# Patient Record
Sex: Female | Born: 1990
Health system: Southern US, Community
[De-identification: ages and names within clinical notes are randomized; demographics above are authoritative.]

---

## 2015-11-15 ENCOUNTER — Emergency Department (HOSPITAL_BASED_OUTPATIENT_CLINIC_OR_DEPARTMENT_OTHER)
Admission: EM | Admit: 2015-11-15 | Discharge: 2015-11-15 | Disposition: A | Discharge status: Home / Self Care | Payer: Self-pay | Attending: Emergency Medicine | Admitting: Emergency Medicine

## 2015-11-15 ENCOUNTER — Encounter (HOSPITAL_BASED_OUTPATIENT_CLINIC_OR_DEPARTMENT_OTHER): Payer: Self-pay | Admitting: Emergency Medicine

## 2015-11-15 DIAGNOSIS — M545 Low back pain, unspecified: Secondary | ICD-10-CM

## 2015-11-15 MED ORDER — METHOCARBAMOL 500 MG PO TABS: 500.0000 mg | ORAL_TABLET | Freq: Two times a day (BID) | ORAL | Status: DC

## 2015-11-15 MED ORDER — ACETAMINOPHEN 325 MG PO TABS
650.0000 mg | ORAL_TABLET | Freq: Once | ORAL | Status: AC
  Administered 2015-11-15: 650 mg via ORAL
  Filled 2015-11-15: qty 2

## 2015-11-15 MED ORDER — METHOCARBAMOL 500 MG PO TABS
500.0000 mg | ORAL_TABLET | Freq: Once | ORAL | Status: AC
  Administered 2015-11-15: 500 mg via ORAL
  Filled 2015-11-15: qty 1

## 2015-11-15 NOTE — Discharge Instructions (Signed)
Back Pain, Adult °Back pain is very common in adults. The cause of back pain is rarely dangerous and the pain often gets better over time. The cause of your back pain may not be known. Some common causes of back pain include: °1. Strain of the muscles or ligaments supporting the spine. °2. Wear and tear (degeneration) of the spinal disks. °3. Arthritis. °4. Direct injury to the back. °For many people, back pain may return. Since back pain is rarely dangerous, most people can learn to manage this condition on their own. °HOME CARE INSTRUCTIONS °Watch your back pain for any changes. The following actions may help to lessen any discomfort you are feeling: °1. Remain active. It is stressful on your back to sit or stand in one place for long periods of time. Do not sit, drive, or stand in one place for more than 30 minutes at a time. Take short walks on even surfaces as soon as you are able. Try to increase the length of time you walk each day. °2. Exercise regularly as directed by your health care provider. Exercise helps your back heal faster. It also helps avoid future injury by keeping your muscles strong and flexible. °3. Do not stay in bed. Resting more than 1-2 days can delay your recovery. °4. Pay attention to your body when you bend and lift. The most comfortable positions are those that put less stress on your recovering back. Always use proper lifting techniques, including: °1. Bending your knees. °2. Keeping the load close to your body. °3. Avoiding twisting. °5. Find a comfortable position to sleep. Use a firm mattress and lie on your side with your knees slightly bent. If you lie on your back, put a pillow under your knees. °6. Avoid feeling anxious or stressed. Stress increases muscle tension and can worsen back pain. It is important to recognize when you are anxious or stressed and learn ways to manage it, such as with exercise. °7. Take medicines only as directed by your health care provider.  Over-the-counter medicines to reduce pain and inflammation are often the most helpful. Your health care provider may prescribe muscle relaxant drugs. These medicines help dull your pain so you can more quickly return to your normal activities and healthy exercise. °8. Apply ice to the injured area: °1. Put ice in a plastic bag. °2. Place a towel between your skin and the bag. °3. Leave the ice on for 20 minutes, 2-3 times a day for the first 2-3 days. After that, ice and heat may be alternated to reduce pain and spasms. °9. Maintain a healthy weight. Excess weight puts extra stress on your back and makes it difficult to maintain good posture. °SEEK MEDICAL CARE IF: °1. You have pain that is not relieved with rest or medicine. °2. You have increasing pain going down into the legs or buttocks. °3. You have pain that does not improve in one week. °4. You have night pain. °5. You lose weight. °6. You have a fever or chills. °SEEK IMMEDIATE MEDICAL CARE IF:  °1. You develop new bowel or bladder control problems. °2. You have unusual weakness or numbness in your arms or legs. °3. You develop nausea or vomiting. °4. You develop abdominal pain. °5. You feel faint. °  °This information is not intended to replace advice given to you by your health care provider. Make sure you discuss any questions you have with your health care provider. °  °Document Released: 08/12/2005 Document Revised: 09/02/2014 Document Reviewed: 12/14/2013 °[employer] Interactive Patient Education ©2016 [employer] ° °Back Exercises °The following exercises strengthen the muscles that help to support the back. They also help to keep the lower back flexible. Doing these exercises can help to prevent back pain or lessen existing pain. °If you have back pain or discomfort, try doing these exercises 2-3 times each day or as told by your health care provider. When the pain goes away, do them once each day, but increase the number of times that you repeat the  steps for each exercise (do more repetitions). If you do not have back pain or discomfort, do these exercises once each day or as told by your health care provider. °EXERCISES °Single Knee to Chest °Repeat these steps 3-5 times for each leg: °5. Lie on your back on a firm bed or the floor with your legs extended. °6. Bring one knee to your chest. Your other leg should stay extended and in contact with the floor. °7. Hold your knee in place by grabbing your knee or thigh. °8. Pull on your knee until you feel a gentle stretch in your lower back. °9. Hold the stretch for 10-30 seconds. °10. Slowly release and straighten your leg. °Pelvic Tilt °Repeat these steps 5-10 times: °10. Lie on your back on a firm bed or the floor with your legs extended. °11. Bend your knees so they are pointing toward the ceiling and your feet are flat on the floor. °12. Tighten your lower abdominal muscles to press your lower back against the floor. This motion will tilt your pelvis so your tailbone points up toward the ceiling instead of pointing to your feet or the floor. °13. With gentle tension and even breathing, hold this position for 5-10 seconds. °Cat-Cow °Repeat these steps until your lower back becomes more flexible: °7. Get into a hands-and-knees position on a firm surface. Keep your hands under your shoulders, and keep your knees under your hips. You may place padding under your knees for comfort. °8. Let your head hang down, and point your tailbone toward the floor so your lower back becomes rounded like the back of a cat. °9. Hold this position for 5 seconds. °10. Slowly lift your head and point your tailbone up toward the ceiling so your back forms a sagging arch like the back of a cow. °11. Hold this position for 5 seconds. °Press-Ups °Repeat these steps 5-10 times: °6. Lie on your abdomen (face-down) on the floor. °7. Place your palms near your head, about shoulder-width apart. °8. While you keep your back as relaxed as  possible and keep your hips on the floor, slowly straighten your arms to raise the top half of your body and lift your shoulders. Do not use your back muscles to raise your upper torso. You may adjust the placement of your hands to make yourself more comfortable. °9. Hold this position for 5 seconds while you keep your back relaxed. °10. Slowly return to lying flat on the floor. °Bridges °Repeat these steps 10 times: °1. Lie on your back on a firm surface. °2. Bend your knees so they are pointing toward the ceiling and your feet are flat on the floor. °3. Tighten your buttocks muscles and lift your buttocks off of the floor until your waist is at almost the same height as your knees. You should feel the muscles working in your buttocks and the back of your thighs. If you do not feel these muscles, slide your feet 1-2 inches farther away from your buttocks. °4. Hold this   position for 3-5 seconds. °5. Slowly lower your hips to the starting position, and allow your buttocks muscles to relax completely. °If this exercise is too easy, try doing it with your arms crossed over your chest. °Abdominal Crunches °Repeat these steps 5-10 times: °1. Lie on your back on a firm bed or the floor with your legs extended. °2. Bend your knees so they are pointing toward the ceiling and your feet are flat on the floor. °3. Cross your arms over your chest. °4. Tip your chin slightly toward your chest without bending your neck. °5. Tighten your abdominal muscles and slowly raise your trunk (torso) high enough to lift your shoulder blades a tiny bit off of the floor. Avoid raising your torso higher than that, because it can put too much stress on your low back and it does not help to strengthen your abdominal muscles. °6. Slowly return to your starting position. °Back Lifts °Repeat these steps 5-10 times: °1. Lie on your abdomen (face-down) with your arms at your sides, and rest your forehead on the floor. °2. Tighten the muscles in your  legs and your buttocks. °3. Slowly lift your chest off of the floor while you keep your hips pressed to the floor. Keep the back of your head in line with the curve in your back. Your eyes should be looking at the floor. °4. Hold this position for 3-5 seconds. °5. Slowly return to your starting position. °SEEK MEDICAL CARE IF: °· Your back pain or discomfort gets much worse when you do an exercise. °· Your back pain or discomfort does not lessen within 2 hours after you exercise. °If you have any of these problems, stop doing these exercises right away. Do not do them again unless your health care provider says that you can. °SEEK IMMEDIATE MEDICAL CARE IF: °· You develop sudden, severe back pain. If this happens, stop doing the exercises right away. Do not do them again unless your health care provider says that you can. °  °This information is not intended to replace advice given to you by your health care provider. Make sure you discuss any questions you have with your health care provider. °  °Document Released: 09/19/2004 Document Revised: 05/03/2015 Document Reviewed: 10/06/2014 °[employer]vier Interactive Patient Education ©2016 [employer] ° °

## 2015-11-15 NOTE — ED Notes (Signed)
Patient states that she woke up this am with lower back pain. States that it was so bad she could not go to work today. Denis any injury

## 2015-11-15 NOTE — ED Provider Notes (Signed)
CSN: [id number]     Arrival date & time 11/15/15  1718 History   First MD Initiated Contact with Patient 11/15/15 1831     Chief Complaint  Patient presents with  . Back Pain   Patient is a 25 y.o. female presenting with back pain. The history is provided by the patient. No language interpreter was used.  Back Pain Location:  Lumbar spine Quality:  Aching Radiates to:  Does not radiate Pain severity:  Moderate Onset quality:  Gradual Duration:  1 day Timing:  Constant Progression:  Unchanged Chronicity:  Recurrent Context: lifting heavy objects   Context: not falling, not MVA, not recent injury and not twisting   Relieved by:  Bed rest Worsened by:  Ambulation and bending Ineffective treatments:  None tried Associated symptoms: no abdominal pain, no bladder incontinence, no bowel incontinence, no dysuria, no fever, no leg pain, no numbness, no paresthesias, no perianal numbness and no weakness    Jaclyn Lawson is a 25 year old female presenting with back pain. Onset of pain was yesterday. The pain is located over the lumbar region. The pain does not radiate to the legs. She describes it as an aching pain. 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 Bending and ambulating exacerbate the pain. She states the pain was so severe that she did not go to work today. Massaging the area improves the pain. She has not taken any over-the-counter pain relievers. She reports a history of similar asked pain related to her work. She states that she has tried Neurontin for this in the past with no improvement. She does not have a PCP who follows her for her episodic back pain. She denies bowel or bladder incontinence or saddle anesthesias. She denies trauma to the back. Denies fevers, chills, abdominal pain, nausea, vomiting, dysuria, hematuria, vaginal discharge, extremity numbness, extremity weakness or gait difficulties.  History reviewed. No pertinent past medical history. History  reviewed. No pertinent past surgical history. History reviewed. No pertinent family history. Social History  Substance Use Topics  . Smoking status: Never Smoker   . Smokeless tobacco: None  . Alcohol Use: No   OB History    No data available     Review of Systems  Constitutional: Negative for fever.  Gastrointestinal: Negative for abdominal pain and bowel incontinence.  Genitourinary: Negative for bladder incontinence and dysuria.  Musculoskeletal: Positive for back pain.  Neurological: Negative for weakness, numbness and paresthesias.  All other systems reviewed and are negative.     Allergies  Review of patient's allergies indicates no known allergies.  Home Medications   Prior to Admission medications   Medication Sig Start Date End Date Taking? Authorizing Provider  methocarbamol (ROBAXIN) 500 MG tablet Take 1 tablet (500 mg total) by mouth 2 (two) times daily. 11/15/15   Valari Taylor, PA-C   BP 124/88 mmHg  Pulse 89  Temp(Src) 97.7 F (36.5 C) (Oral)  Resp 18  Ht  (1.651 m)  Wt 74.844 kg  BMI 27.46 kg/m2  SpO2 100%  LMP 10/25/2015 Physical Exam  Constitutional: She appears well-developed and well-nourished. No distress.  HENT:  Head: Normocephalic and atraumatic.  Right Ear: External ear normal.  Left Ear: External ear normal.  Eyes: Conjunctivae are normal. Right eye exhibits no discharge. Left eye exhibits no discharge. No scleral icterus.  Neck: Normal range of motion.  Cardiovascular: Normal rate and intact distal pulses.   Pedal pulses palpable.  Pulmonary/Chest: Effort normal.  Musculoskeletal: Normal  range of motion.       Lumbar back: She exhibits tenderness. She exhibits normal range of motion, no deformity and no spasm.       Back:  Generalized tenderness over the lumbar region. No focal tenderness over the lumbar spine. No lumbar spine deformity or step-offs. Full range of motion of the thoracic and lumbar spine intact. Patient walks with  a steady gait. She moves all extremities spontaneously without pain. Cancer supple without swelling or deformity.  Neurological: She is alert. Coordination normal.  5/5 strength of the bilateral lower extremities. Sensation to light touch intact over the bilateral lower extremities.  Skin: Skin is warm and dry.  No rashes or skin changes noted over the lumbar region.  Psychiatric: She has a normal mood and affect. Her behavior is normal.  Nursing note and vitals reviewed.   ED Course  Procedures (including critical care time) Labs Review Labs Reviewed - No data to display  Imaging Review No results found. I have personally reviewed and evaluated these images and lab results as part of my medical decision-making.   EKG Interpretation None      MDM   Final diagnoses:  Bilateral low back pain without sciatica   Patient presenting with back pain x 1 day. Afebrile. Non-focal neuro exam. Generalized tenderness over the lumbar region without focal pain over L spine. FROM of BLE intact. Patient is able to ambulate without discomfort. No loss of bowel or bladder control. No numbness or weakness in the lower extremities. No concern for cauda equina. No history of IVDU or cancer. Pt's back pain likely secondary to heavy lifting at work. Conservative therapy including back exercises, heat, ice, tylenol or ibuprofen discussed. Will give muscle relaxer for pain relief. Discussed side effects of muscle relaxer and avoiding driving. Pt will follow up with her PCP if symptoms do not improve. Return precautions discussed and given in discharge paperwork. Pt is stable for discharge.      Alveta HeimlichStevi Durene Dodge, PA-C 11/15/15 1904  Loren Raceravid Yelverton, MD 11/16/15 [phone]

## 2016-04-10 ENCOUNTER — Emergency Department (HOSPITAL_BASED_OUTPATIENT_CLINIC_OR_DEPARTMENT_OTHER)
Admission: EM | Admit: 2016-04-10 | Discharge: 2016-04-10 | Disposition: A | Discharge status: Home / Self Care | Payer: Self-pay | Attending: Dermatology | Admitting: Dermatology

## 2016-04-10 ENCOUNTER — Encounter (HOSPITAL_BASED_OUTPATIENT_CLINIC_OR_DEPARTMENT_OTHER): Payer: Self-pay | Admitting: *Deleted

## 2016-04-10 DIAGNOSIS — M549 Dorsalgia, unspecified: Secondary | ICD-10-CM | POA: Insufficient documentation

## 2016-04-10 DIAGNOSIS — Z5321 Procedure and treatment not carried out due to patient leaving prior to being seen by health care provider: Secondary | ICD-10-CM | POA: Insufficient documentation

## 2016-04-10 DIAGNOSIS — F17200 Nicotine dependence, unspecified, uncomplicated: Secondary | ICD-10-CM | POA: Insufficient documentation

## 2016-04-10 NOTE — ED Triage Notes (Signed)
Pt c/o upper back pain x 2 days increased pain with movt and cough

## 2016-04-10 NOTE — ED Notes (Signed)
Pt seen leaving lobby cursing at staff about long wait, pt alert verbal and amb w/o difficulty

## 2017-07-07 ENCOUNTER — Encounter (HOSPITAL_BASED_OUTPATIENT_CLINIC_OR_DEPARTMENT_OTHER): Payer: Self-pay

## 2017-07-07 ENCOUNTER — Emergency Department (HOSPITAL_BASED_OUTPATIENT_CLINIC_OR_DEPARTMENT_OTHER)
Admission: EM | Admit: 2017-07-07 | Discharge: 2017-07-07 | Disposition: A | Discharge status: Home / Self Care | Payer: No Typology Code available for payment source | Attending: Emergency Medicine | Admitting: Emergency Medicine

## 2017-07-07 ENCOUNTER — Emergency Department (HOSPITAL_BASED_OUTPATIENT_CLINIC_OR_DEPARTMENT_OTHER): Payer: No Typology Code available for payment source

## 2017-07-07 DIAGNOSIS — Y9389 Activity, other specified: Secondary | ICD-10-CM | POA: Insufficient documentation

## 2017-07-07 DIAGNOSIS — S0083XA Contusion of other part of head, initial encounter: Secondary | ICD-10-CM | POA: Insufficient documentation

## 2017-07-07 DIAGNOSIS — W500XXA Accidental hit or strike by another person, initial encounter: Secondary | ICD-10-CM | POA: Insufficient documentation

## 2017-07-07 DIAGNOSIS — S0081XA Abrasion of other part of head, initial encounter: Secondary | ICD-10-CM | POA: Insufficient documentation

## 2017-07-07 DIAGNOSIS — H1132 Conjunctival hemorrhage, left eye: Secondary | ICD-10-CM | POA: Diagnosis not present

## 2017-07-07 DIAGNOSIS — F17210 Nicotine dependence, cigarettes, uncomplicated: Secondary | ICD-10-CM | POA: Insufficient documentation

## 2017-07-07 DIAGNOSIS — Y929 Unspecified place or not applicable: Secondary | ICD-10-CM | POA: Diagnosis not present

## 2017-07-07 DIAGNOSIS — Y999 Unspecified external cause status: Secondary | ICD-10-CM | POA: Diagnosis not present

## 2017-07-07 DIAGNOSIS — S0993XA Unspecified injury of face, initial encounter: Secondary | ICD-10-CM

## 2017-07-07 DIAGNOSIS — S0990XA Unspecified injury of head, initial encounter: Secondary | ICD-10-CM | POA: Diagnosis present

## 2017-07-07 MED ORDER — PROPARACAINE HCL 0.5 % OP SOLN
2.0000 [drp] | Freq: Once | OPHTHALMIC | Status: AC
  Administered 2017-07-07: 2 [drp] via OPHTHALMIC
  Filled 2017-07-07: qty 15

## 2017-07-07 MED ORDER — HYDROCODONE-ACETAMINOPHEN 5-325 MG PO TABS: 1.0000 | ORAL_TABLET | ORAL | 0 refills | Status: AC | PRN

## 2017-07-07 MED ORDER — FLUORESCEIN SODIUM 0.6 MG OP STRP
ORAL_STRIP | OPHTHALMIC | Status: AC
  Filled 2017-07-07: qty 1

## 2017-07-07 MED ORDER — FLUORESCEIN SODIUM 1 MG OP STRP
1.0000 | ORAL_STRIP | Freq: Once | OPHTHALMIC | Status: AC
  Administered 2017-07-07: 1 via OPHTHALMIC

## 2017-07-07 MED FILL — HYDROCODON-APAP 5-325: 5-325 | 1 days supply | Qty: 6 | Fill #0
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
## 2017-07-07 NOTE — Discharge Instructions (Signed)
Your images today showed no evidence for fracture.  There was evidence of soft tissue injury to the left face.  Your eye exam did not show evidence of injury to the cornea.  Please use the pain medication to help with your symptoms.  You may use an ice pack to help with the inflammation.  Please follow-up with a primary care physician for reassessment.  If any symptoms change or worsen, please return to the nearest emergency department.
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
## 2018-04-16 ENCOUNTER — Encounter (HOSPITAL_BASED_OUTPATIENT_CLINIC_OR_DEPARTMENT_OTHER): Payer: Self-pay | Admitting: Emergency Medicine

## 2018-04-16 ENCOUNTER — Other Ambulatory Visit: Payer: Self-pay

## 2018-04-16 ENCOUNTER — Emergency Department (HOSPITAL_BASED_OUTPATIENT_CLINIC_OR_DEPARTMENT_OTHER)
Admission: EM | Admit: 2018-04-16 | Discharge: 2018-04-16 | Disposition: A | Discharge status: Home / Self Care | Payer: No Typology Code available for payment source | Attending: Emergency Medicine | Admitting: Emergency Medicine

## 2018-04-16 DIAGNOSIS — M545 Low back pain, unspecified: Secondary | ICD-10-CM

## 2018-04-16 DIAGNOSIS — F17200 Nicotine dependence, unspecified, uncomplicated: Secondary | ICD-10-CM | POA: Diagnosis not present

## 2018-04-16 LAB — URINALYSIS, ROUTINE W REFLEX MICROSCOPIC
Bilirubin Urine: NEGATIVE
Glucose, UA: NEGATIVE mg/dL
Ketones, ur: NEGATIVE mg/dL
LEUKOCYTES UA: NEGATIVE
NITRITE: NEGATIVE
PH: 6 (ref 5.0–8.0)
Protein, ur: NEGATIVE mg/dL
SPECIFIC GRAVITY, URINE: 1.025 (ref 1.005–1.030)

## 2018-04-16 LAB — URINALYSIS, MICROSCOPIC (REFLEX)

## 2018-04-16 LAB — PREGNANCY, URINE: Preg Test, Ur: NEGATIVE

## 2018-04-16 MED ORDER — NAPROXEN 500 MG PO TABS: 500.0000 mg | ORAL_TABLET | Freq: Two times a day (BID) | ORAL | 0 refills | Status: AC
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
## 2018-04-16 NOTE — ED Triage Notes (Addendum)
Mid bilateral flank pain since yesterday afternoon. Denies fevers, injury, dysuria. Reports currently on menstrual cycle.
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
## 2020-08-21 ENCOUNTER — Encounter (HOSPITAL_BASED_OUTPATIENT_CLINIC_OR_DEPARTMENT_OTHER): Payer: Self-pay | Admitting: *Deleted

## 2020-08-21 ENCOUNTER — Emergency Department (HOSPITAL_BASED_OUTPATIENT_CLINIC_OR_DEPARTMENT_OTHER)
Admission: EM | Admit: 2020-08-21 | Discharge: 2020-08-21 | Disposition: A | Discharge status: Home / Self Care | Payer: No Typology Code available for payment source | Attending: Emergency Medicine | Admitting: Emergency Medicine

## 2020-08-21 ENCOUNTER — Other Ambulatory Visit: Payer: Self-pay

## 2020-08-21 DIAGNOSIS — Z5321 Procedure and treatment not carried out due to patient leaving prior to being seen by health care provider: Secondary | ICD-10-CM | POA: Insufficient documentation

## 2020-08-21 DIAGNOSIS — Z20822 Contact with and (suspected) exposure to covid-19: Secondary | ICD-10-CM | POA: Insufficient documentation

## 2020-08-21 DIAGNOSIS — J029 Acute pharyngitis, unspecified: Secondary | ICD-10-CM | POA: Insufficient documentation

## 2020-08-21 DIAGNOSIS — R509 Fever, unspecified: Secondary | ICD-10-CM | POA: Insufficient documentation

## 2020-08-21 LAB — RESP PANEL BY RT-PCR (FLU A&B, COVID) ARPGX2
Influenza A by PCR: NEGATIVE
Influenza B by PCR: NEGATIVE
SARS Coronavirus 2 by RT PCR: NEGATIVE

## 2020-08-21 LAB — GROUP A STREP BY PCR: Group A Strep by PCR: NOT DETECTED

## 2020-08-21 NOTE — ED Triage Notes (Signed)
Sore throat and fever this am. No fever reducer today.

## 2020-12-17 ENCOUNTER — Encounter (HOSPITAL_BASED_OUTPATIENT_CLINIC_OR_DEPARTMENT_OTHER): Payer: Self-pay | Admitting: Emergency Medicine

## 2020-12-17 ENCOUNTER — Emergency Department (HOSPITAL_BASED_OUTPATIENT_CLINIC_OR_DEPARTMENT_OTHER)
Admission: EM | Admit: 2020-12-17 | Discharge: 2020-12-17 | Disposition: A | Discharge status: Home / Self Care | Payer: BLUE CROSS/BLUE SHIELD | Attending: Physician Assistant | Admitting: Physician Assistant

## 2020-12-17 ENCOUNTER — Other Ambulatory Visit: Payer: Self-pay

## 2020-12-17 DIAGNOSIS — Z5321 Procedure and treatment not carried out due to patient leaving prior to being seen by health care provider: Secondary | ICD-10-CM | POA: Diagnosis not present

## 2020-12-17 DIAGNOSIS — R509 Fever, unspecified: Secondary | ICD-10-CM | POA: Insufficient documentation

## 2020-12-17 DIAGNOSIS — R059 Cough, unspecified: Secondary | ICD-10-CM | POA: Insufficient documentation

## 2020-12-17 DIAGNOSIS — R519 Headache, unspecified: Secondary | ICD-10-CM | POA: Insufficient documentation

## 2020-12-17 MED ORDER — ONDANSETRON 4 MG PO TBDP
8.0000 mg | ORAL_TABLET | Freq: Once | ORAL | Status: DC
  Filled 2020-12-17: qty 2

## 2020-12-17 NOTE — ED Triage Notes (Signed)
Pt arrives pov with c/o generalized body aches, HA, cough and fever. Pt endorses Covid + test today

## 2020-12-17 NOTE — ED Notes (Signed)
Pulled to triage room to carry out orders.  No answer.  Per registration she left.

## 2020-12-17 NOTE — ED Triage Notes (Signed)
Emergency Medicine Provider Triage Evaluation Note  Jaclyn Lawson , a 30 y.o. female  was evaluated in triage.  Pt complains of body aches, n/v/d, dry cough, HA. Sx started yesterday. Having difficulty with PO intake due to her n/v. Had a + home covid test earlier today. No bloody stools, hematemesis, rhinorrhea, sore throat, CP, SOB. Has not been vaccinated for covid.   Physical Exam  BP (!) 135/95 (BP Location: Right Arm)   Pulse 97   Temp 98.6 F (37 C) (Oral)   Resp 18   LMP 12/13/2020   SpO2 98%  Gen:   Awake, no distress   HEENT:  Atraumatic  Resp:  Normal effort  Cardiac:  Normal rate  Abd:   Nondistended, nontender  MSK:   Moves extremities without difficulty  Neuro:  Speech clear   Medical Decision Making  Medically screening exam initiated at 7:48 PM.  Appropriate orders placed.  Jaclyn Lawson was informed that the remainder of the evaluation will be completed by another provider, this initial triage assessment does not replace that evaluation, and the importance of remaining in the ED until their evaluation is complete.   Jaclyn Sou, PA-C 12/17/20 1951

## 2023-07-28 ENCOUNTER — Other Ambulatory Visit: Payer: Self-pay

## 2023-07-28 ENCOUNTER — Emergency Department (HOSPITAL_BASED_OUTPATIENT_CLINIC_OR_DEPARTMENT_OTHER)
Admission: EM | Admit: 2023-07-28 | Discharge: 2023-07-28 | Disposition: A | Discharge status: Home / Self Care | Payer: BLUE CROSS/BLUE SHIELD | Attending: Emergency Medicine | Admitting: Emergency Medicine

## 2023-07-28 ENCOUNTER — Encounter (HOSPITAL_BASED_OUTPATIENT_CLINIC_OR_DEPARTMENT_OTHER): Payer: Self-pay

## 2023-07-28 DIAGNOSIS — Z20822 Contact with and (suspected) exposure to covid-19: Secondary | ICD-10-CM | POA: Diagnosis not present

## 2023-07-28 DIAGNOSIS — J069 Acute upper respiratory infection, unspecified: Secondary | ICD-10-CM | POA: Diagnosis not present

## 2023-07-28 DIAGNOSIS — J029 Acute pharyngitis, unspecified: Secondary | ICD-10-CM | POA: Diagnosis present

## 2023-07-28 DIAGNOSIS — F17200 Nicotine dependence, unspecified, uncomplicated: Secondary | ICD-10-CM | POA: Insufficient documentation

## 2023-07-28 LAB — RESP PANEL BY RT-PCR (RSV, FLU A&B, COVID)  RVPGX2
Influenza A by PCR: NEGATIVE
Influenza B by PCR: NEGATIVE
Resp Syncytial Virus by PCR: NEGATIVE
SARS Coronavirus 2 by RT PCR: NEGATIVE

## 2023-07-28 LAB — GROUP A STREP BY PCR: Group A Strep by PCR: NOT DETECTED

## 2023-07-28 NOTE — ED Provider Notes (Signed)
Emergency Department Provider Note   I have reviewed the triage vital signs and the nursing notes.   HISTORY  Chief Complaint Sore Throat   HPI Jaclyn Lawson is a 32 y.o. female presents emergency department evaluation of cough, congestion, sore throat over the past 3 days.  Her boyfriend is here with similar symptoms.  She denies any chest pain or shortness of breath.  No known sick contacts but did gather with others for Thanksgiving.  No abdominal pain, vomiting, diarrhea.    History reviewed. No pertinent past medical history.  Review of Systems  Constitutional: No fever/chills. Positive body aches.  ENT: Positive sore throat. Cardiovascular: Denies chest pain. Respiratory: Denies shortness of breath. Positive cough.  Gastrointestinal: No abdominal pain.  No nausea, no vomiting.  No diarrhea.  Skin: Negative for rash. Neurological: Positive HA.   ____________________________________________   PHYSICAL EXAM:  VITAL SIGNS: ED Triage Vitals  Encounter Vitals Group     BP 07/28/23 0921 (!) 137/105     Pulse Rate 07/28/23 0921 66     Resp 07/28/23 0921 16     Temp 07/28/23 0921 98.4 F (36.9 C)     Temp Source 07/28/23 0921 Oral     SpO2 07/28/23 0921 99 %     Weight 07/28/23 0919 160 lb (72.6 kg)     Height 07/28/23 0919 5\' 5"  (1.651 m)   Constitutional: Alert and oriented. Well appearing and in no acute distress. Eyes: Conjunctivae are normal.  Head: Atraumatic. Nose: Positive congestion/rhinnorhea. Mouth/Throat: Mucous membranes are moist.  Oropharynx with mild erythema. No exudate. No PTA.  Neck: No stridor.   Cardiovascular: Normal rate, regular rhythm. Good peripheral circulation. Grossly normal heart sounds.   Respiratory: Normal respiratory effort.  No retractions. Lungs CTAB. Gastrointestinal: Soft and nontender. No distention.  Musculoskeletal: No gross deformities of extremities. Neurologic:  Normal speech and language.  Skin:  Skin is warm,  dry and intact. No rash noted.  ____________________________________________   LABS (all labs ordered are listed, but only abnormal results are displayed)  Labs Reviewed  GROUP A STREP BY PCR  RESP PANEL BY RT-PCR (RSV, FLU A&B, COVID)  [id number]   ____________________________________________   PROCEDURES  Procedure(s) performed:   Procedures  None  ____________________________________________   INITIAL IMPRESSION / ASSESSMENT AND PLAN / ED COURSE  Pertinent labs & imaging results that were available during my care of the patient were reviewed by me and considered in my medical decision making (see chart for details).   This patient is Presenting for Evaluation of URI, which does require a range of treatment options, and is a complaint that involves a moderate risk of morbidity and mortality.  The Differential Diagnoses include COVID, Flu, RSV, CAP, strep PCR, etc.  Clinical Laboratory Tests Ordered, included strep negative. COVID/Flu/RSV and Strep negative.   Radiologic Tests: Considered CXR but clear lungs. No distress. No hypoxemia.   Cardiac Monitor Tracing which shows NSR.    Social Determinants of Health Risk patient is a smoker.   Medical Decision Making: Summary:  Patient presents emergency department with sore throat and URI symptoms.  No distress with largely unremarkable vitals signs.   Reevaluation with update and discussion with patient. Plan for continued supportive care at home and close PCP follow up.    Patient's presentation is most consistent with acute presentation with potential threat to life or bodily function.   Disposition: discharge  ____________________________________________  FINAL CLINICAL IMPRESSION(S) / ED DIAGNOSES  Final diagnoses:  Viral  URI with cough    Note:  This document was prepared using Dragon voice recognition software and may include unintentional dictation errors.  Alona Bene, MD, [facility] Emergency Medicine     Braylan Faul, Arlyss Repress, MD 07/29/23 [phone]

## 2023-07-28 NOTE — ED Triage Notes (Signed)
C/o sore throat, dry mouth, cough since Friday.

## 2023-07-28 NOTE — Discharge Instructions (Signed)
We believe your persistent cough is a result of a viral syndrome for which your symptoms have still not quite resolved.  Sometimes it takes many weeks to completely go away, especially if you smoke or have chronic lung problems.  Please take any medications prescribed and follow up as recommended with your regular doctor. ° °If you develop any new or worsening symptoms, including but not limited to fever, persistent vomiting, worsening shortness of breath, or other symptoms that concern you, please return to the Emergency Department immediately. ° ° °
